# Patient Record
Sex: Female | Born: 1990 | Race: White | Hispanic: No | Marital: Married | Smoking: Never smoker
Health system: Southern US, Community
[De-identification: ages and names within clinical notes are randomized; demographics above are authoritative.]

## PROBLEM LIST (undated history)

## (undated) DIAGNOSIS — K429 Umbilical hernia without obstruction or gangrene: Secondary | ICD-10-CM

## (undated) HISTORY — PX: SKIN GRAFT: SHX250

---
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
## 2017-03-19 ENCOUNTER — Encounter: Payer: Self-pay | Admitting: Emergency Medicine

## 2017-03-19 ENCOUNTER — Emergency Department
Admission: EM | Admit: 2017-03-19 | Discharge: 2017-03-19 | Disposition: A | Discharge status: Home / Self Care | Payer: Self-pay | Attending: Emergency Medicine | Admitting: Emergency Medicine

## 2017-03-19 ENCOUNTER — Emergency Department: Payer: Self-pay

## 2017-03-19 DIAGNOSIS — F1210 Cannabis abuse, uncomplicated: Secondary | ICD-10-CM | POA: Insufficient documentation

## 2017-03-19 DIAGNOSIS — Y998 Other external cause status: Secondary | ICD-10-CM | POA: Insufficient documentation

## 2017-03-19 DIAGNOSIS — Y9301 Activity, walking, marching and hiking: Secondary | ICD-10-CM | POA: Insufficient documentation

## 2017-03-19 DIAGNOSIS — W19XXXA Unspecified fall, initial encounter: Secondary | ICD-10-CM

## 2017-03-19 DIAGNOSIS — S82822A Torus fracture of lower end of left fibula, initial encounter for closed fracture: Secondary | ICD-10-CM | POA: Insufficient documentation

## 2017-03-19 DIAGNOSIS — Y929 Unspecified place or not applicable: Secondary | ICD-10-CM | POA: Insufficient documentation

## 2017-03-19 DIAGNOSIS — Z91040 Latex allergy status: Secondary | ICD-10-CM | POA: Insufficient documentation

## 2017-03-19 DIAGNOSIS — W010XXA Fall on same level from slipping, tripping and stumbling without subsequent striking against object, initial encounter: Secondary | ICD-10-CM | POA: Insufficient documentation

## 2017-03-19 HISTORY — DX: Umbilical hernia without obstruction or gangrene: K42.9

## 2017-03-19 MED ORDER — HYDROCODONE-ACETAMINOPHEN 5-325 MG PO TABS: 1.0000 | ORAL_TABLET | Freq: Four times a day (QID) | ORAL | 0 refills | Status: AC | PRN

## 2017-03-19 MED ORDER — OXYCODONE-ACETAMINOPHEN 5-325 MG PO TABS
1.0000 | ORAL_TABLET | Freq: Once | ORAL | Status: AC
  Administered 2017-03-19: 1 via ORAL
  Filled 2017-03-19: qty 1

## 2017-03-19 NOTE — ED Triage Notes (Signed)
Pt comes into the ED via POV c/o left ankle pain from where she slipped down a ramp and felt a "pop".  Patient in NAD at this time with even and unlabored respiration and no deformity noted to the ankle.

## 2017-03-19 NOTE — Discharge Instructions (Signed)
Ice and elevation.  Do not put weight on your foot due to your injury.  Use crutches for walking.  Call Dr. Elenor LegatoHooten's office who is one  of the orthopedist in the [facility] orthopedic department for an appointment.  Continue taking pain medication only as directed.  This medication could cause drowsiness and increase your risk for falling.

## 2017-03-19 NOTE — ED Notes (Signed)
NAD noted at time of D/C. Pt denies questions or concerns. Pt taken to the lobby via wheelchair at this time.  

## 2017-03-19 NOTE — ED Provider Notes (Signed)
[facility] Emergency Department Provider Note  ____________________________________________   First MD Initiated Contact with Patient 03/19/17 1037     (approximate)  I have reviewed the triage vital signs and the nursing notes.   HISTORY  Chief Complaint Ankle Pain  HPI Erica Rangel is a 27 y.o. female is here complaining of left ankle pain.  Patient states that she slipped down a ramp earlier this morning and felt a "pop".  Patient states that is been very difficult and painful for her to walk since then.  She denies any head injury or loss of consciousness with this incident.  She rates her pain as an 8/10.   Past Medical History:  Diagnosis Date  . Umbilical hernia     There are no active problems to display for this patient.   Past Surgical History:  Procedure Laterality Date  . SKIN GRAFT      Prior to Admission medications   Medication Sig Start Date End Date Taking? Authorizing Provider  HYDROcodone-acetaminophen (NORCO/VICODIN) 5-325 MG tablet Take 1 tablet by mouth every 6 (six) hours as needed for moderate pain. 03/19/17   Tommi RumpsSummers, Rhonda L, PA-C    Allergies Cephalosporins; Latex; and Sulfa antibiotics  No family history on file.  Social History Social History   Tobacco Use  . Smoking status: Never Smoker  . Smokeless tobacco: Never Used  Substance Use Topics  . Alcohol use: Yes    Comment: occasional  . Drug use: Yes    Types: Marijuana    Review of Systems Constitutional: No fever/chills Eyes: No visual changes. ENT: No trauma. Cardiovascular: Denies chest pain. Respiratory: Denies shortness of breath. Gastrointestinal: No abdominal pain.  No nausea, no vomiting.  Musculoskeletal: Positive for left ankle pain. Skin: Negative for rash. Neurological: Negative for headaches, focal weakness or numbness. ____________________________________________   PHYSICAL EXAM:  VITAL SIGNS: ED Triage Vitals  Enc  Vitals Group     BP 03/19/17 0933 135/83     Pulse Rate 03/19/17 0933 99     Resp 03/19/17 0933 17     Temp 03/19/17 0933 98.1 F (36.7 C)     Temp Source 03/19/17 0933 Oral     SpO2 03/19/17 0933 100 %     Weight 03/19/17 0934 140 lb (63.5 kg)     Height 03/19/17 0934 5\' 3"  (1.6 m)     Head Circumference --      Peak Flow --      Pain Score 03/19/17 0936 8     Pain Loc --      Pain Edu? --      Excl. in GC? --    Constitutional: Alert and oriented. Well appearing and in no acute distress. Eyes: Conjunctivae are normal. PERRL. EOMI. Head: Atraumatic. Nose: No congestion/rhinnorhea. Mouth/Throat: Mucous membranes are moist.  Oropharynx non-erythematous. Neck: No stridor.   Cardiovascular: Normal rate, regular rhythm. Grossly normal heart sounds.  Good peripheral circulation. Respiratory: Normal respiratory effort.  No retractions. Lungs CTAB. Gastrointestinal: Soft and nontender. No distention.  Musculoskeletal: Examination of left ankle there is moderate soft tissue swelling on the lateral aspect.  Area is markedly tender to palpation.  Patient is able to minimally flex and extend her foot.  Skin is intact.  Motor sensory function intact distal to her injury.  Capillary refill is less than 3 seconds Neurologic:  Normal speech and language. No gross focal neurologic deficits are appreciated.  Skin:  Skin is warm, dry and intact. No rash  noted. Psychiatric: Mood and affect are normal. Speech and behavior are normal.  ____________________________________________   LABS (all labs ordered are listed, but only abnormal results are displayed)  Labs Reviewed - No data to display  RADIOLOGY  ED MD interpretation:   Positive fracture distal fibula seen with slight displacement.  Official radiology report(s): Dg Ankle Complete Left  Result Date: 03/19/2017 CLINICAL DATA:  Ankle pain.  Fall. EXAM: LEFT ANKLE COMPLETE - 3+ VIEW COMPARISON:  No recent prior. FINDINGS: An oblique  fracture of the distal left fibula is noted. Fracture slightly displaced. No other focal abnormality identified. IMPRESSION: Slightly displaced oblique fracture of the distal fibula. Associated prominent soft tissue swelling noted. Electronically Signed   By: Maisie Fus  Register   On: 03/19/2017 10:06    ____________________________________________   PROCEDURES  Procedure(s) performed:   .Splint Application Date/Time: 03/19/2017 3:21 PM Performed by: Marguerita Merles, NT Authorized by: Tommi Rumps, PA-C   Consent:    Consent obtained:  Verbal   Consent given by:  Patient   Risks discussed:  Pain   Alternatives discussed:  Referral Pre-procedure details:    Sensation:  Normal Procedure details:    Laterality:  Left   Location:  Ankle   Ankle:  L ankle   Cast type:  Short leg   Splint type:  Sugar tong   Supplies:  Ortho-Glass Post-procedure details:    Pain:  Improved   Sensation:  Normal   Patient tolerance of procedure:  Tolerated well, no immediate complications    Critical Care performed: No  ____________________________________________   INITIAL IMPRESSION / ASSESSMENT AND PLAN / ED COURSE  Patient was made aware that she does have a fracture.  Patient is [place] the area from [place] of [place] and is concerned about her insurance.  Patient is to follow-up with Dr. Ernest Pine at [facility] orthopedic department if she decides to remain in this area.  She initially had planned on staying here for 30 days.  Patient was placed in an OCL splint and given crutches.  She was discharged with a prescription for Norco as needed for pain every 6 hours.  She is instructed to ice and elevate over the weekend and leave the splint on until seen by the orthopedist.  ____________________________________________   FINAL CLINICAL IMPRESSION(S) / ED DIAGNOSES  Final diagnoses:  Closed torus fracture of distal end of left fibula, initial encounter  Fall, initial encounter     ED  Discharge Orders        Ordered    HYDROcodone-acetaminophen (NORCO/VICODIN) 5-325 MG tablet  Every 6 hours PRN     03/19/17 1213       Note:  This document was prepared using Dragon voice recognition software and may include unintentional dictation errors.    Tommi Rumps, PA-C 03/19/17 1522    Sharyn Creamer, MD 03/19/17 1626

## 2018-07-28 IMAGING — CR DG ANKLE COMPLETE 3+V*L*
1 series · 3 of 3 positions shown · non-contrast
Comparison: No recent prior.

CLINICAL DATA: Ankle pain.  Fall.

EXAM:
LEFT ANKLE COMPLETE - 3+ VIEW

[Series 1: dg ankle complete left · 0.14mm/px · 3 of 3 slices shown]
[im 1/3]
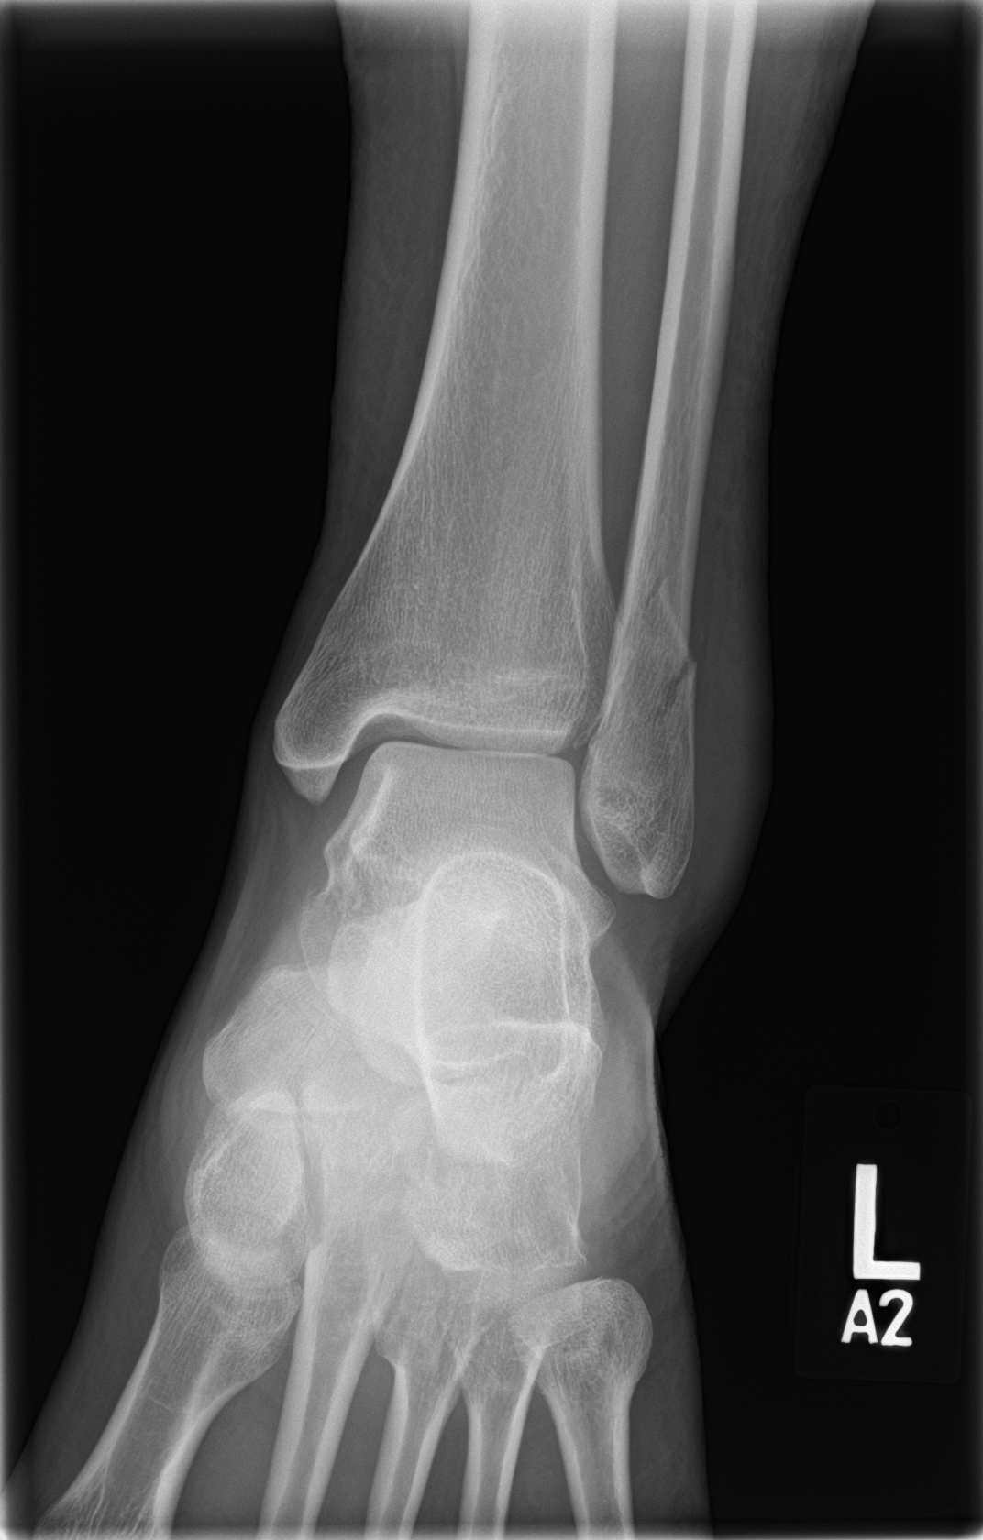
[im 2/3]
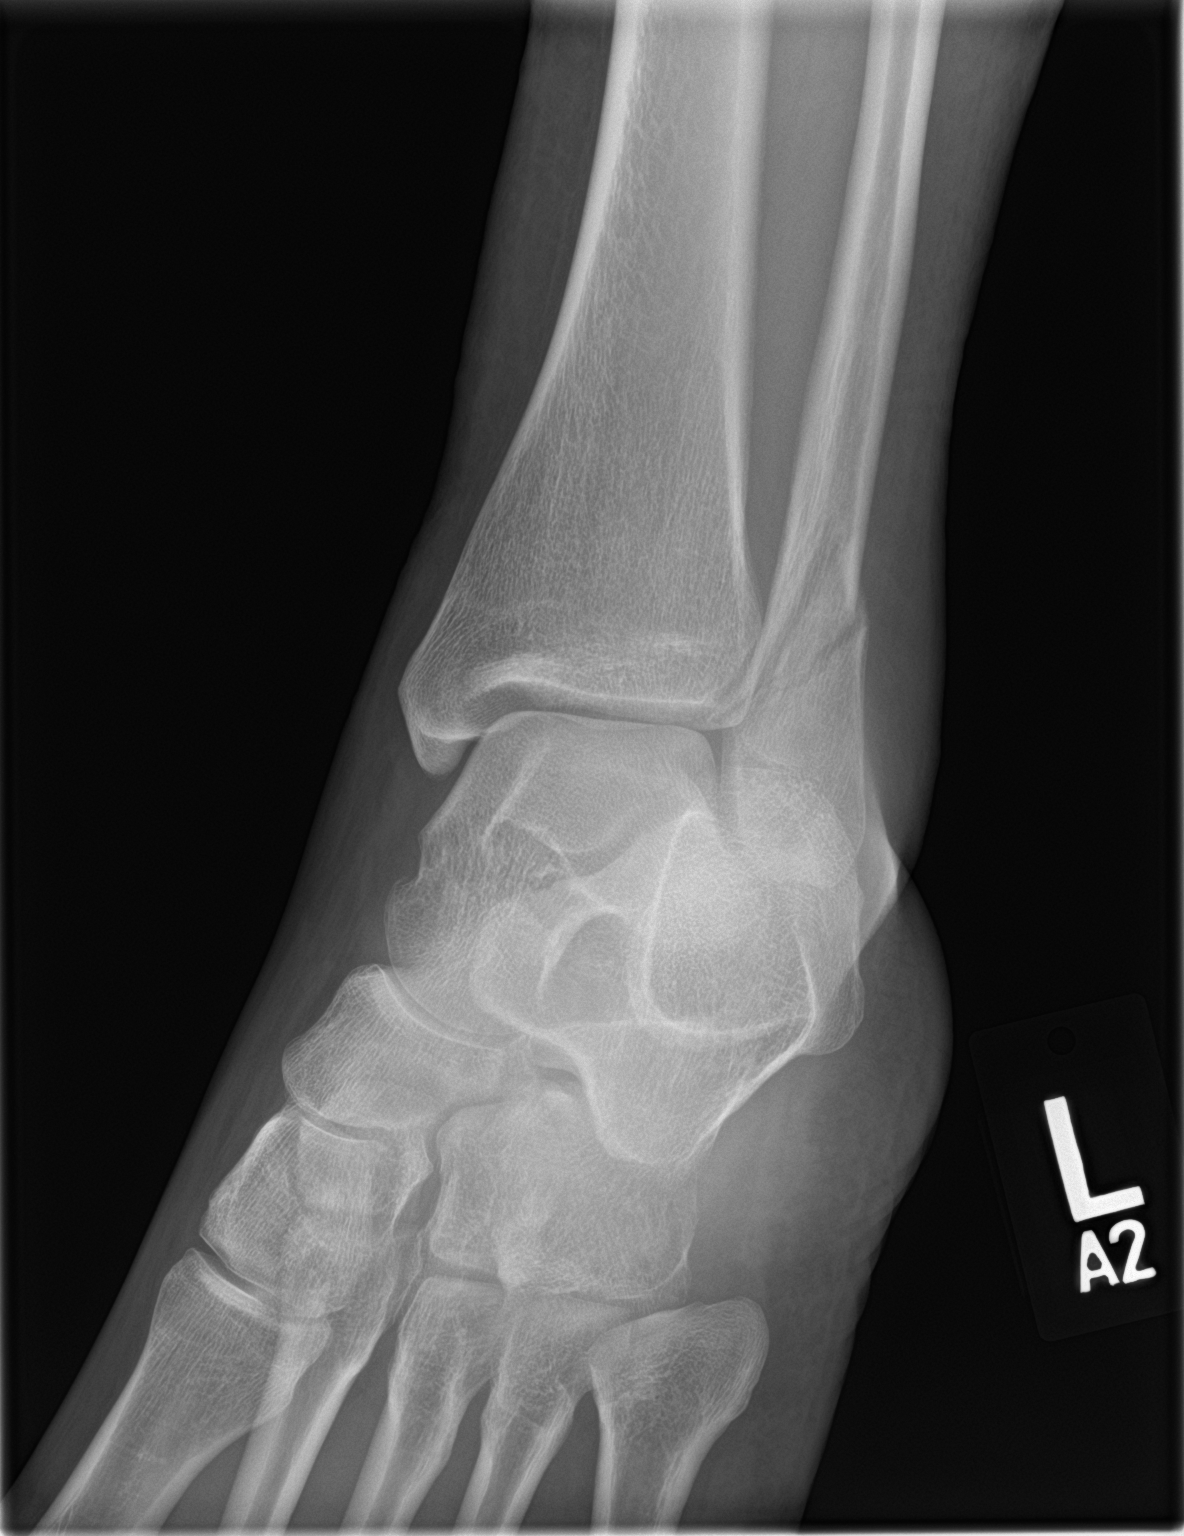
[im 3/3]
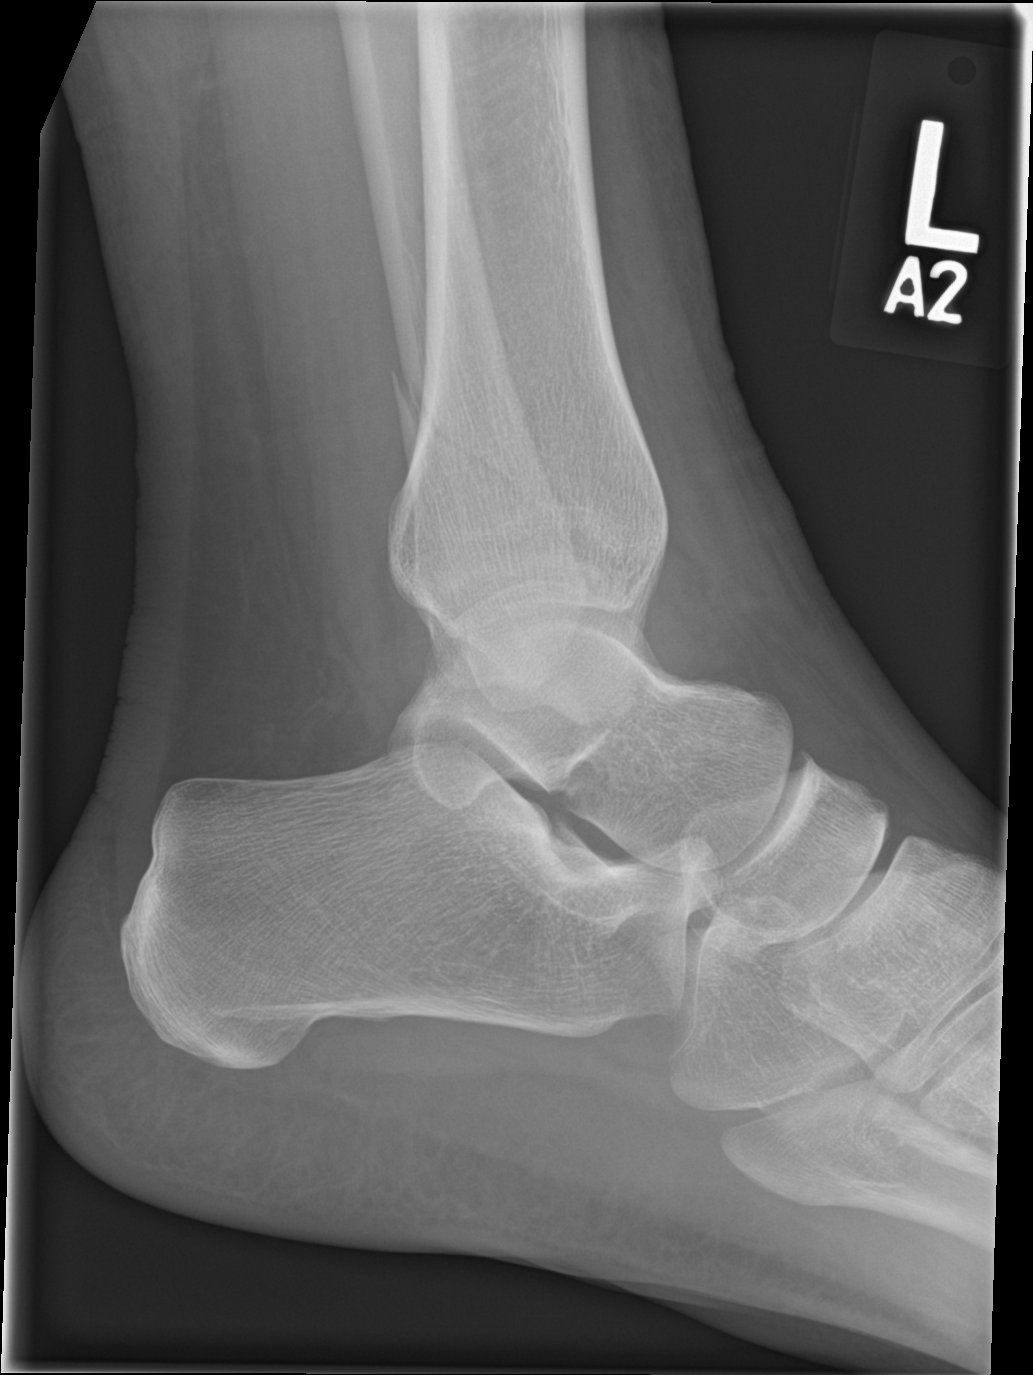

[3 of 3 positions shown; findings below may reference images not displayed]

FINDINGS: An oblique fracture of the distal left fibula is noted. Fracture
slightly displaced. No other focal abnormality identified.
IMPRESSION: Slightly displaced oblique fracture of the distal fibula. Associated
prominent soft tissue swelling noted.
